# Patient Record
Sex: Male | Born: 1965 | Race: Black or African American | Hispanic: No | Marital: Single | Smoking: Never smoker
Health system: Southern US, Community
[De-identification: ages and names within clinical notes are randomized; demographics above are authoritative.]

---

## 2017-04-25 ENCOUNTER — Emergency Department (HOSPITAL_COMMUNITY)
Admission: EM | Admit: 2017-04-25 | Discharge: 2017-04-26 | Disposition: A | Discharge status: Home / Self Care | Payer: Self-pay | Attending: Emergency Medicine | Admitting: Emergency Medicine

## 2017-04-25 ENCOUNTER — Other Ambulatory Visit: Payer: Self-pay

## 2017-04-25 ENCOUNTER — Emergency Department (HOSPITAL_COMMUNITY): Payer: Self-pay

## 2017-04-25 ENCOUNTER — Encounter (HOSPITAL_COMMUNITY): Payer: Self-pay | Admitting: Emergency Medicine

## 2017-04-25 DIAGNOSIS — M25561 Pain in right knee: Secondary | ICD-10-CM | POA: Insufficient documentation

## 2017-04-25 LAB — CBC WITH DIFFERENTIAL/PLATELET
BASOS ABS: 0 10*3/uL (ref 0.0–0.1)
Basophils Relative: 0 %
Eosinophils Absolute: 0.1 10*3/uL (ref 0.0–0.7)
Eosinophils Relative: 1 %
HEMATOCRIT: 48.9 % (ref 39.0–52.0)
HEMOGLOBIN: 16.8 g/dL (ref 13.0–17.0)
LYMPHS ABS: 1.9 10*3/uL (ref 0.7–4.0)
LYMPHS PCT: 24 %
MCH: 32.7 pg (ref 26.0–34.0)
MCHC: 34.4 g/dL (ref 30.0–36.0)
MCV: 95.3 fL (ref 78.0–100.0)
Monocytes Absolute: 0.9 10*3/uL (ref 0.1–1.0)
Monocytes Relative: 11 %
NEUTROS ABS: 5.3 10*3/uL (ref 1.7–7.7)
Neutrophils Relative %: 64 %
Platelets: 198 10*3/uL (ref 150–400)
RBC: 5.13 MIL/uL (ref 4.22–5.81)
RDW: 12.1 % (ref 11.5–15.5)
WBC: 8.2 10*3/uL (ref 4.0–10.5)

## 2017-04-25 LAB — BASIC METABOLIC PANEL
ANION GAP: 8 (ref 5–15)
BUN: 12 mg/dL (ref 6–20)
CHLORIDE: 103 mmol/L (ref 101–111)
CO2: 26 mmol/L (ref 22–32)
Calcium: 9.3 mg/dL (ref 8.9–10.3)
Creatinine, Ser: 1.13 mg/dL (ref 0.61–1.24)
GFR calc Af Amer: >60 mL/min (ref >60)
GLUCOSE: 101 mg/dL — AB (ref 65–99)
POTASSIUM: 3.8 mmol/L (ref 3.5–5.1)
Sodium: 137 mmol/L (ref 135–145)

## 2017-04-25 LAB — CK: Total CK: 221 U/L (ref 49–397)

## 2017-04-25 MED ORDER — HYDROCODONE-ACETAMINOPHEN 5-325 MG PO TABS
2.0000 | ORAL_TABLET | Freq: Once | ORAL | Status: AC
  Administered 2017-04-25: 2 via ORAL
  Filled 2017-04-25: qty 2

## 2017-04-25 MED ORDER — HYDROCODONE-ACETAMINOPHEN 5-325 MG PO TABS: 1.0000 | ORAL_TABLET | Freq: Four times a day (QID) | ORAL | 0 refills | Status: DC | PRN

## 2017-04-25 NOTE — ED Triage Notes (Signed)
Pt states he took a nap this afternoon, when he woke up his right knee was swollen and he was unable to apply a lot of pressure. Pt denies injury/trauma, no long distance trips.

## 2017-04-25 NOTE — ED Provider Notes (Signed)
[facility] EMERGENCY DEPARTMENT Provider Note   CSN: [id number] Arrival date & time: 04/25/17  1623     History   Chief Complaint Chief Complaint  Patient presents with  . Leg Pain    HPI Celene SquibbSean Shutters is a 51 y.o. male.  Patient presents to the ED with a chief complaint of right knee pain and swelling.  He states that he slept on the couch, and may have twisted it in his sleep.  He states that sometimes his leg gets stuck in the couch, and he believes this may have happened and then he rolled over.  He is not 100% certain though.  He denies any other traumatic injuries.  He reports increased pain with ambulation and knee flexion.  He states that he feels a pressure in the upper part of the knee.  He denies any calf pain or ankle swelling.  Denies any fevers, chills, IV drug use, penile discharge.  He rates the pain as moderate.  He denies any other associated symptoms.   The history is provided by the patient. No language interpreter was used.    History reviewed. No pertinent past medical history.  There are no active problems to display for this patient.   History reviewed. No pertinent surgical history.     Home Medications    Prior to Admission medications   Not on File    Family History No family history on file.  Social History Social History   Tobacco Use  . Smoking status: Never Smoker  . Smokeless tobacco: Never Used  Substance Use Topics  . Alcohol use: Yes  . Drug use: No     Allergies   Patient has no known allergies.   Review of Systems Review of Systems  All other systems reviewed and are negative.    Physical Exam Updated Vital Signs BP (!) 170/93 (BP Location: Left Arm)   Pulse 87   Temp 98.7 F (37.1 C) (Oral)   Resp 18   Ht 5\' 8"  (1.727 m)   Wt 92.5 kg (204 lb)   SpO2 96%   BMI 31.02 kg/m   Physical Exam Nursing note and vitals reviewed.  Constitutional: Pt appears well-developed and well-nourished.  No distress.  HENT:  Head: Normocephalic and atraumatic.  Eyes: Conjunctivae are normal.  Neck: Normal range of motion.  Cardiovascular: Normal rate, regular rhythm. Intact distal pulses.   Capillary refill < 3 sec.  Pulmonary/Chest: Effort normal and breath sounds normal.  Musculoskeletal:  Right knee Pt exhibits moderate swelling and TTP anteriorly and superiorly.   ROM: 3/5 limited by pain  Strength: 3/5 limited by pain  Neurological: Pt  is alert. Coordination normal.  Sensation: 5/5 Skin: Skin is warm and dry. Pt is not diaphoretic.  No evidence of open wound or skin tenting Psychiatric: Pt has a normal mood and affect.     ED Treatments / Results  Labs (all labs ordered are listed, but only abnormal results are displayed) Labs Reviewed  BASIC METABOLIC PANEL - Abnormal; Notable for the following components:      Result Value   Glucose, Bld 101 (*)    All other components within normal limits  CBC WITH DIFFERENTIAL/PLATELET  CK    EKG  EKG Interpretation None       Radiology No results found.  Procedures Procedures (including critical care time)  Medications Ordered in ED Medications  HYDROcodone-acetaminophen (NORCO/VICODIN) 5-325 MG per tablet 2 tablet (not administered)  Initial Impression / Assessment and Plan / ED Course  I have reviewed the triage vital signs and the nursing notes.  Pertinent labs & imaging results that were available during my care of the patient were reviewed by me and considered in my medical decision making (see chart for details).     Patient presents with pain in right knee.  DDx includes, fracture, strain, or sprain.  Consultants: none  Plain films reveal soft tissue edema, no fracture, dislocation, or effusion.  No sign of septic joint, DVT, or abscess.  Labs ordered in triage are reassuring. Pt advised to follow up with PCP and/or orthopedics. Patient given brace and crutches while in ED, conservative therapy  such as RICE recommended and discussed.   Patient will be discharged home & is agreeable with above plan. Returns precautions discussed. Pt appears safe for discharge.   Final Clinical Impressions(s) / ED Diagnoses   Final diagnoses:  Acute pain of right knee    ED Discharge Orders        Ordered    HYDROcodone-acetaminophen (NORCO/VICODIN) 5-325 MG tablet  Every 6 hours PRN     04/25/17 2341       Roxy HorsemanBrowning, Manan Olmo, PA-C 04/25/17 2343    Little, Ambrose Finlandachel Morgan, MD 04/27/17 1009

## 2017-04-25 NOTE — ED Notes (Addendum)
Pt also reports generalized muscle soreness and cramping.

## 2017-04-25 NOTE — ED Notes (Signed)
Patient transported to x-ray. ?

## 2017-04-26 NOTE — Progress Notes (Signed)
Orthopedic Tech Progress Note Patient Details:  Kenneth SquibbSean Harris 08/30/1965 [id number]  Ortho Devices Type of Ortho Device: Knee Immobilizer, Crutches Ortho Device/Splint Location: rle Ortho Device/Splint Interventions: Ordered, Application, Adjustment   Trinna PostMartinez, Eoin Willden J 04/26/2017, 12:26 AM

## 2017-08-30 ENCOUNTER — Ambulatory Visit (INDEPENDENT_AMBULATORY_CARE_PROVIDER_SITE_OTHER): Payer: PRIVATE HEALTH INSURANCE | Admitting: Family Medicine

## 2017-08-30 ENCOUNTER — Encounter: Payer: Self-pay | Admitting: Family Medicine

## 2017-08-30 VITALS — BP 130/90 | HR 77 | Ht 68.5 in | Wt 211.2 lb

## 2017-08-30 DIAGNOSIS — Z0001 Encounter for general adult medical examination with abnormal findings: Secondary | ICD-10-CM | POA: Diagnosis not present

## 2017-08-30 DIAGNOSIS — B002 Herpesviral gingivostomatitis and pharyngotonsillitis: Secondary | ICD-10-CM | POA: Insufficient documentation

## 2017-08-30 DIAGNOSIS — A6001 Herpesviral infection of penis: Secondary | ICD-10-CM

## 2017-08-30 DIAGNOSIS — S81812A Laceration without foreign body, left lower leg, initial encounter: Secondary | ICD-10-CM | POA: Insufficient documentation

## 2017-08-30 DIAGNOSIS — Z23 Encounter for immunization: Secondary | ICD-10-CM | POA: Diagnosis not present

## 2017-08-30 LAB — COMPREHENSIVE METABOLIC PANEL
ALT: 19 U/L (ref 0–53)
AST: 23 U/L (ref 0–37)
Albumin: 4 g/dL (ref 3.5–5.2)
Alkaline Phosphatase: 48 U/L (ref 39–117)
BUN: 15 mg/dL (ref 6–23)
CO2: 29 mEq/L (ref 19–32)
Calcium: 9 mg/dL (ref 8.4–10.5)
Chloride: 102 mEq/L (ref 96–112)
Creatinine, Ser: 1.1 mg/dL (ref 0.40–1.50)
GFR: 91.29 mL/min (ref >60.00)
Glucose, Bld: 96 mg/dL (ref 70–99)
Potassium: 3.9 mEq/L (ref 3.5–5.1)
Sodium: 140 mEq/L (ref 135–145)
Total Bilirubin: 0.5 mg/dL (ref 0.2–1.2)
Total Protein: 6.7 g/dL (ref 6.0–8.3)

## 2017-08-30 LAB — URINALYSIS, ROUTINE W REFLEX MICROSCOPIC
Bilirubin Urine: NEGATIVE
Ketones, ur: NEGATIVE
Nitrite: NEGATIVE
Specific Gravity, Urine: 1.025 (ref 1.000–1.030)
Total Protein, Urine: NEGATIVE
Urine Glucose: NEGATIVE
Urobilinogen, UA: 0.2 (ref 0.0–1.0)
pH: 6 (ref 5.0–8.0)

## 2017-08-30 LAB — LIPID PANEL
Cholesterol: 188 mg/dL (ref 0–200)
HDL: 25.6 mg/dL — ABNORMAL LOW (ref >39.00)
LDL Cholesterol: 128 mg/dL — ABNORMAL HIGH (ref 0–99)
NonHDL: 162.58
Total CHOL/HDL Ratio: 7
Triglycerides: 171 mg/dL — ABNORMAL HIGH (ref 0.0–149.0)
VLDL: 34.2 mg/dL (ref 0.0–40.0)

## 2017-08-30 LAB — CBC
HCT: 46.4 % (ref 39.0–52.0)
Hemoglobin: 15.9 g/dL (ref 13.0–17.0)
MCHC: 34.3 g/dL (ref 30.0–36.0)
MCV: 94.6 fl (ref 78.0–100.0)
Platelets: 207 10*3/uL (ref 150.0–400.0)
RBC: 4.91 Mil/uL (ref 4.22–5.81)
RDW: 12.6 % (ref 11.5–15.5)
WBC: 3.7 10*3/uL — ABNORMAL LOW (ref 4.0–10.5)

## 2017-08-30 LAB — PSA: PSA: 6.61 ng/mL — ABNORMAL HIGH (ref 0.10–4.00)

## 2017-08-30 MED ORDER — VALACYCLOVIR HCL 1 G PO TABS
500.0000 mg | ORAL_TABLET | Freq: Two times a day (BID) | ORAL | 0 refills | Status: AC
Start: 2017-08-30 — End: 2017-09-02

## 2017-08-30 NOTE — Patient Instructions (Signed)
Health Maintenance, Male A healthy lifestyle and preventive care is important for your health and wellness. Ask your health care provider about what schedule of regular examinations is right for you. What should I know about weight and diet? Eat a Healthy Diet  Eat plenty of vegetables, fruits, whole grains, low-fat dairy products, and lean protein.  Do not eat a lot of foods high in solid fats, added sugars, or salt.  Maintain a Healthy Weight Regular exercise can help you achieve or maintain a healthy weight. You should:  Do at least 150 minutes of exercise each week. The exercise should increase your heart rate and make you sweat (moderate-intensity exercise).  Do strength-training exercises at least twice a week.  Watch Your Levels of Cholesterol and Blood Lipids  Have your blood tested for lipids and cholesterol every 5 years starting at 50 years of age. If you are at high risk for heart disease, you should start having your blood tested when you are 50 years old. You may need to have your cholesterol levels checked more often if: ? Your lipid or cholesterol levels are high. ? You are older than 50 years of age. ? You are at high risk for heart disease.  What should I know about cancer screening? Many types of cancers can be detected early and may often be prevented. Lung Cancer  You should be screened every year for lung cancer if: ? You are a current smoker who has smoked for at least 30 years. ? You are a former smoker who has quit within the past 15 years.  Talk to your health care provider about your screening options, when you should start screening, and how often you should be screened.  Colorectal Cancer  Routine colorectal cancer screening usually begins at 50 years of age and should be repeated every 5-10 years until you are 50 years old. You may need to be screened more often if early forms of precancerous polyps or small growths are found. Your health care provider  may recommend screening at an earlier age if you have risk factors for colon cancer.  Your health care provider may recommend using home test kits to check for hidden blood in the stool.  A small camera at the end of a tube can be used to examine your colon (sigmoidoscopy or colonoscopy). This checks for the earliest forms of colorectal cancer.  Prostate and Testicular Cancer  Depending on your age and overall health, your health care provider may do certain tests to screen for prostate and testicular cancer.  Talk to your health care provider about any symptoms or concerns you have about testicular or prostate cancer.  Skin Cancer  Check your skin from head to toe regularly.  Tell your health care provider about any new moles or changes in moles, especially if: ? There is a change in a mole's size, shape, or color. ? You have a mole that is larger than a pencil eraser.  Always use sunscreen. Apply sunscreen liberally and repeat throughout the day.  Protect yourself by wearing long sleeves, pants, a wide-brimmed hat, and sunglasses when outside.  What should I know about heart disease, diabetes, and high blood pressure?  If you are 89-13 years of age, have your blood pressure checked every 3-5 years. If you are 70 years of age or older, have your blood pressure checked every year. You should have your blood pressure measured twice-once when you are at a hospital or clinic, and once when  you are not at a hospital or clinic. Record the average of the two measurements. To check your blood pressure when you are not at a hospital or clinic, you can use: ? An automated blood pressure machine at a pharmacy. ? A home blood pressure monitor.  Talk to your health care provider about your target blood pressure.  If you are between 59-6 years old, ask your health care provider if you should take aspirin to prevent heart disease.  Have regular diabetes screenings by checking your fasting blood  sugar level. ? If you are at a normal weight and have a low risk for diabetes, have this test once every three years after the age of 32. ? If you are overweight and have a high risk for diabetes, consider being tested at a younger age or more often.  A one-time screening for abdominal aortic aneurysm (AAA) by ultrasound is recommended for men aged 27-75 years who are current or former smokers. What should I know about preventing infection? Hepatitis B If you have a higher risk for hepatitis B, you should be screened for this virus. Talk with your health care provider to find out if you are at risk for hepatitis B infection. Hepatitis C Blood testing is recommended for:  Everyone born from [birth year] through 1963.  Anyone with known risk factors for hepatitis C.  Sexually Transmitted Diseases (STDs)  You should be screened each year for STDs including gonorrhea and chlamydia if: ? You are sexually active and are younger than 50 years of age. ? You are older than 50 years of age and your health care provider tells you that you are at risk for this type of infection. ? Your sexual activity has changed since you were last screened and you are at an increased risk for chlamydia or gonorrhea. Ask your health care provider if you are at risk.  Talk with your health care provider about whether you are at high risk of being infected with HIV. Your health care provider may recommend a prescription medicine to help prevent HIV infection.  What else can I do?  Schedule regular health, dental, and eye exams.  Stay current with your vaccines (immunizations).  Do not use any tobacco products, such as cigarettes, chewing tobacco, and e-cigarettes. If you need help quitting, ask your health care provider.  Limit alcohol intake to no more than 2 drinks per day. One drink equals 12 ounces of beer, 5 ounces of wine, or 1 ounces of hard liquor.  Do not use street drugs.  Do not share needles.  Ask your  health care provider for help if you need support or information about quitting drugs.  Tell your health care provider if you often feel depressed.  Tell your health care provider if you have ever been abused or do not feel safe at home. This information is not intended to replace advice given to you by your health care provider. Make sure you discuss any questions you have with your health care provider. Document Released: 11/12/2007 Document Revised: 01/13/2016 Document Reviewed: 02/17/2015 Elsevier Interactive Patient Education  2018 [employer] Years, Male Preventive care refers to lifestyle choices and visits with your health care provider that can promote health and wellness. What does preventive care include?  A yearly physical exam. This is also called an annual well check.  Dental exams once or twice a year.  Routine eye exams. Ask your health care provider how often you should have  your eyes checked.  Personal lifestyle choices, including: ? Daily care of your teeth and gums. ? Regular physical activity. ? Eating a healthy diet. ? Avoiding tobacco and drug use. ? Limiting alcohol use. ? Practicing safe sex. ? Taking low-dose aspirin every day starting at age 50. What happens during an annual well check? The services and screenings done by your health care provider during your annual well check will depend on your age, overall health, lifestyle risk factors, and family history of disease. Counseling Your health care provider may ask you questions about your:  Alcohol use.  Tobacco use.  Drug use.  Emotional well-being.  Home and relationship well-being.  Sexual activity.  Eating habits.  Work and work environment.  Screening You may have the following tests or measurements:  Height, weight, and BMI.  Blood pressure.  Lipid and cholesterol levels. These may be checked every 5 years, or more frequently if you are over 50 years  old.  Skin check.  Lung cancer screening. You may have this screening every year starting at age 55 if you have a 30-pack-year history of smoking and currently smoke or have quit within the past 15 years.  Fecal occult blood test (FOBT) of the stool. You may have this test every year starting at age 50.  Flexible sigmoidoscopy or colonoscopy. You may have a sigmoidoscopy every 5 years or a colonoscopy every 10 years starting at age 50.  Prostate cancer screening. Recommendations will vary depending on your family history and other risks.  Hepatitis C blood test.  Hepatitis B blood test.  Sexually transmitted disease (STD) testing.  Diabetes screening. This is done by checking your blood sugar (glucose) after you have not eaten for a while (fasting). You may have this done every 1-3 years.  Discuss your test results, treatment options, and if necessary, the need for more tests with your health care provider. Vaccines Your health care provider may recommend certain vaccines, such as:  Influenza vaccine. This is recommended every year.  Tetanus, diphtheria, and acellular pertussis (Tdap, Td) vaccine. You may need a Td booster every 10 years.  Varicella vaccine. You may need this if you have not been vaccinated.  Zoster vaccine. You may need this after age 60.  Measles, mumps, and rubella (MMR) vaccine. You may need at least one dose of MMR if you were born in 1957 or later. You may also need a second dose.  Pneumococcal 13-valent conjugate (PCV13) vaccine. You may need this if you have certain conditions and have not been vaccinated.  Pneumococcal polysaccharide (PPSV23) vaccine. You may need one or two doses if you smoke cigarettes or if you have certain conditions.  Meningococcal vaccine. You may need this if you have certain conditions.  Hepatitis A vaccine. You may need this if you have certain conditions or if you travel or work in places where you may be exposed to  hepatitis A.  Hepatitis B vaccine. You may need this if you have certain conditions or if you travel or work in places where you may be exposed to hepatitis B.  Haemophilus influenzae type b (Hib) vaccine. You may need this if you have certain risk factors.  Talk to your health care provider about which screenings and vaccines you need and how often you need them. This information is not intended to replace advice given to you by your health care provider. Make sure you discuss any questions you have with your health care provider. Document Released: 06/12/2015 Document   Revised: 02/03/2016 Document Reviewed: 03/17/2015 Elsevier Interactive Patient Education  2018 [employer].  Genital Herpes Genital herpes is a common sexually transmitted infection (STI) that is caused by a virus. The virus spreads from person to person through sexual contact. Infection can cause itching, blisters, and sores around the genitals or rectum. Symptoms may last several days and then go away This is called an outbreak. However, the virus remains in your body, so you may have more outbreaks in the future. The time between outbreaks varies and can be months or years. Genital herpes affects men and women. It is particularly concerning for pregnant women because the virus can be passed to the baby during delivery and can cause serious problems. Genital herpes is also a concern for people who have a weak disease-fighting (immune) system. What are the causes? This condition is caused by the herpes simplex virus (HSV) type 1 or type 2. The virus may spread through:  Sexual contact with an infected person, including vaginal, anal, and oral sex.  Contact with fluid from a herpes sore.  The skin. This means that you can get herpes from an infected partner even if he or she does not have a visible sore or does not know that he or she is infected.  What increases the risk? You are more likely to develop this condition  if:  You have sex with many partners.  You do not use latex condoms during sex.  What are the signs or symptoms? Most people do not have symptoms (asymptomatic) or have mild symptoms that may be mistaken for other skin problems. Symptoms may include:  Small red bumps near the genitals, rectum, or mouth. These bumps turn into blisters and then turn into sores.  Flu-like symptoms, including: ? Fever. ? Body aches. ? Swollen lymph nodes. ? Headache.  Painful urination.  Pain and itching in the genital area or rectal area.  Vaginal discharge.  Tingling or shooting pain in the legs and buttocks.  Generally, symptoms are more severe and last longer during the first (primary) outbreak. Flu-like symptoms are also more common during the primary outbreak. How is this diagnosed? Genital herpes may be diagnosed based on:  A physical exam.  Your medical history.  Blood tests.  A test of a fluid sample (culture) from an open sore.  How is this treated? There is no cure for this condition, but treatment with antiviral medicines that are taken by mouth (orally) can do the following:  Speed up healing and relieve symptoms.  Help to reduce the spread of the virus to sexual partners.  Limit the chance of future outbreaks, or make future outbreaks shorter.  Lessen symptoms of future outbreaks.  Your health care provider may also recommend pain relief medicines, such as aspirin or ibuprofen. Follow these instructions at home: Sexual activity  Do not have sexual contact during active outbreaks.  Practice safe sex. Latex condoms and male condoms may help prevent the spread of the herpes virus. General instructions  Keep the affected areas dry and clean.  Take over-the-counter and prescription medicines only as told by your health care provider.  Avoid rubbing or touching blisters and sores. If you do touch blisters or sores: ? Wash your hands thoroughly with soap and  water. ? Do not touch your eyes afterward.  To help relieve pain or itching, you may take the following actions as directed by your health care provider: ? Apply a cold, wet cloth (cold compress) to affected areas 4-6  times a day. ? Apply a substance that protects your skin and reduces bleeding (astringent). ? Apply a gel that helps relieve pain around sores (lidocaine gel). ? Take a warm, shallow bath that cleans the genital area (sitz bath).  Keep all follow-up visits as told by your health care provider. This is important. How is this prevented?  Use condoms. Although anyone can get genital herpes during sexual contact, even with the use of a condom, a condom can provide some protection.  Avoid having multiple sexual partners.  Talk with your sexual partner about any symptoms either of you may have. Also, talk with your partner about any history of STIs.  Get tested for STIs before you have sex. Ask your partner to do the same.  Do not have sexual contact if you have symptoms of genital herpes. Contact a health care provider if:  Your symptoms are not improving with medicine.  Your symptoms return.  You have new symptoms.  You have a fever.  You have abdominal pain.  You have redness, swelling, or pain in your eye.  You notice new sores on other parts of your body.  You are a woman and experience bleeding between menstrual periods.  You have had herpes and you become pregnant or plan to become pregnant. Summary  Genital herpes is a common sexually transmitted infection (STI) that is caused by the herpes simplex virus (HSV) type 1 or type 2.  These viruses are most often spread through sexual contact with an infected person.  You are more likely to develop this condition if you have sex with many partners or you have unprotected sex.  Most people do not have symptoms (asymptomatic) or have mild symptoms that may be mistaken for other skin problems. Symptoms occur as  outbreaks that may happen months or years apart.  There is no cure for this condition, but treatment with oral antiviral medicines can reduce symptoms, reduce the chance of spreading the virus to a partner, prevent future outbreaks, or shorten future outbreaks. This information is not intended to replace advice given to you by your health care provider. Make sure you discuss any questions you have with your health care provider. Document Released: 05/13/2000 Document Revised: 04/15/2016 Document Reviewed: 04/15/2016 Elsevier Interactive Patient Education  [employer].

## 2017-08-30 NOTE — Progress Notes (Signed)
Subjective:  Patient ID: Kenneth Harris, male    DOB: 05/04/1966  Age: 52 y.o. MRN: [id number]  CC: Establish Care   HPI Kenneth Harris presents for establishment of care and for a physical exam.  He is fasting this morning.  He enjoys good health.  He goes to the gym 4-5 times a week.  He does not smoke or use illicit drugs.  He drinks alcohol occasionally.  He is a DOT driver with an active CDL.  License is due in September.  Past medical history of elevated blood pressure that was more apparent when he lived in [place] a [place] daily route for 18 years.  His blood pressure is dropped since he moved down into this area.  His significant other is expecting a baby soon.  He has a laceration that is healing on his left shin.  He has a 50 year old son who has asthma.  Father's health history is unknown.  His mother has arthritis and she is in her 6080s.  He has a past medical history of genital herpes but has not had an outbreak in over a year and a half.  He has noticed some myopia.  He has noticed some presbycusis.  He has noticed occasional prevoid dribbling mild decrease in the force of his urine stream.  He is not interested in having these treated at this time.  History Kenneth Harris has no past medical history on file.   He has no past surgical history on file.   His family history is not on file.He reports that he has never smoked. He has never used smokeless tobacco. He reports that he drinks alcohol. He reports that he does not use drugs.  Outpatient Medications Prior to Visit  Medication Sig Dispense Refill  . HYDROcodone-acetaminophen (NORCO/VICODIN) 5-325 MG tablet Take 1-2 tablets by mouth every 6 (six) hours as needed. 10 tablet 0   No facility-administered medications prior to visit.     ROS Review of Systems  Constitutional: Negative for chills, fatigue and fever.  HENT: Negative.   Eyes: Negative.   Respiratory: Negative.   Cardiovascular: Negative.   Gastrointestinal:  Negative.   Endocrine: Negative for polyphagia and polyuria.  Genitourinary: Negative for difficulty urinating, frequency and urgency.  Musculoskeletal: Negative.   Skin: Negative for color change and rash.  Allergic/Immunologic: Negative for immunocompromised state.  Neurological: Negative for weakness, light-headedness and headaches.  Hematological: Does not bruise/bleed easily.  Psychiatric/Behavioral: Negative.     Objective:  BP 130/90 (BP Location: Left Arm, Patient Position: Sitting, Cuff Size: Normal)   Pulse 77   Ht 5' 8.5" (1.74 m)   Wt 211 lb 4 oz (95.8 kg)   SpO2 99%   BMI 31.65 kg/m   Physical Exam  Constitutional: He is oriented to person, place, and time. He appears well-developed and well-nourished. No distress.  HENT:  Head: Normocephalic and atraumatic.  Right Ear: External ear normal.  Left Ear: External ear normal.  Mouth/Throat: Oropharynx is clear and moist.  Eyes: Pupils are equal, round, and reactive to light. Conjunctivae and EOM are normal. Right eye exhibits no discharge. Left eye exhibits no discharge. No scleral icterus.  Neck: Normal range of motion. Neck supple. No JVD present. No tracheal deviation present. No thyromegaly present.  Cardiovascular: Normal rate, regular rhythm and normal heart sounds.  Pulmonary/Chest: Effort normal and breath sounds normal. No stridor.  Abdominal: Soft. Bowel sounds are normal. He exhibits no distension. There is no tenderness. There is no  rebound.  Genitourinary: Rectal exam shows no external hemorrhoid, no internal hemorrhoid, no fissure, no mass, no tenderness, anal tone normal and guaiac negative stool. Prostate is enlarged. Prostate is not tender.  Lymphadenopathy:    He has no cervical adenopathy.  Neurological: He is alert and oriented to person, place, and time.  Skin: Skin is warm and dry. He is not diaphoretic.  Psychiatric: He has a normal mood and affect. His behavior is normal.      Assessment &  Plan:   Kipton was seen today for establish care.  Diagnoses and all orders for this visit:  Encounter for health maintenance examination with abnormal findings -     CBC -     Comprehensive metabolic panel -     HIV antibody -     Lipid panel -     Urinalysis, Routine w reflex microscopic -     PSA  Laceration of left lower extremity, initial encounter -     Tdap vaccine greater than or equal to 7yo IM  Herpes simplex infection of penis -     valACYclovir (VALTREX) 1000 MG tablet; Take 0.5 tablets (500 mg total) by mouth 2 (two) times daily for 3 days. As needed for outbreaks.   I have discontinued Kenneth Harris's HYDROcodone-acetaminophen. I am also having him start on valACYclovir.  Meds ordered this encounter  Medications  . valACYclovir (VALTREX) 1000 MG tablet    Sig: Take 0.5 tablets (500 mg total) by mouth 2 (two) times daily for 3 days. As needed for outbreaks.    Dispense:  6 tablet    Refill:  0     Follow-up: Return in about 3 months (around 11/29/2017).  Mliss Sax, MD

## 2017-08-31 ENCOUNTER — Encounter: Payer: Self-pay | Admitting: Family Medicine

## 2017-08-31 LAB — HIV ANTIBODY (ROUTINE TESTING W REFLEX): HIV: NONREACTIVE

## 2017-09-04 ENCOUNTER — Other Ambulatory Visit (HOSPITAL_COMMUNITY)
Admission: RE | Admit: 2017-09-04 | Discharge: 2017-09-04 | Disposition: A | Discharge status: Home / Self Care | Payer: PRIVATE HEALTH INSURANCE | Source: Ambulatory Visit | Attending: Family Medicine | Admitting: Family Medicine

## 2017-09-04 ENCOUNTER — Encounter: Payer: Self-pay | Admitting: Family Medicine

## 2017-09-04 ENCOUNTER — Ambulatory Visit (INDEPENDENT_AMBULATORY_CARE_PROVIDER_SITE_OTHER): Payer: PRIVATE HEALTH INSURANCE | Admitting: Family Medicine

## 2017-09-04 VITALS — BP 142/80 | HR 96 | Ht 68.5 in | Wt 211.0 lb

## 2017-09-04 DIAGNOSIS — N410 Acute prostatitis: Secondary | ICD-10-CM | POA: Insufficient documentation

## 2017-09-04 DIAGNOSIS — E7800 Pure hypercholesterolemia, unspecified: Secondary | ICD-10-CM

## 2017-09-04 DIAGNOSIS — R972 Elevated prostate specific antigen [PSA]: Secondary | ICD-10-CM | POA: Diagnosis not present

## 2017-09-04 DIAGNOSIS — A749 Chlamydial infection, unspecified: Secondary | ICD-10-CM | POA: Diagnosis not present

## 2017-09-04 LAB — URINALYSIS, ROUTINE W REFLEX MICROSCOPIC
BILIRUBIN URINE: NEGATIVE
HGB URINE DIPSTICK: NEGATIVE
KETONES UR: NEGATIVE
NITRITE: NEGATIVE
Specific Gravity, Urine: 1.02 (ref 1.000–1.030)
TOTAL PROTEIN, URINE-UPE24: NEGATIVE
URINE GLUCOSE: NEGATIVE
UROBILINOGEN UA: 0.2 (ref 0.0–1.0)
pH: 6 (ref 5.0–8.0)

## 2017-09-04 MED ORDER — SULFAMETHOXAZOLE-TRIMETHOPRIM 800-160 MG PO TABS: 1.0000 | ORAL_TABLET | Freq: Two times a day (BID) | ORAL | 0 refills | Status: DC

## 2017-09-04 NOTE — Patient Instructions (Signed)
Fat and Cholesterol Restricted Diet Getting too much fat and cholesterol in your diet may cause health problems. Following this diet helps keep your fat and cholesterol at normal levels. This can keep you from getting sick. What types of fat should I choose?  Choose monosaturated and polyunsaturated fats. These are found in foods such as olive oil, canola oil, flaxseeds, walnuts, almonds, and seeds.  Eat more omega-3 fats. Good choices include salmon, mackerel, sardines, tuna, flaxseed oil, and ground flaxseeds.  Limit saturated fats. These are in animal products such as meats, butter, and cream. They can also be in plant products such as palm oil, palm kernel oil, and coconut oil.  Avoid foods with partially hydrogenated oils in them. These contain trans fats. Examples of foods that have trans fats are stick margarine, some tub margarines, cookies, crackers, and other baked goods. What general guidelines do I need to follow?  Check food labels. Look for the words "trans fat" and "saturated fat."  When preparing a meal: ? Fill half of your plate with vegetables and green salads. ? Fill one fourth of your plate with whole grains. Look for the word "whole" as the first word in the ingredient list. ? Fill one fourth of your plate with lean protein foods.  Eat more foods that have fiber, like apples, carrots, beans, peas, and barley.  Eat more home-cooked foods. Eat less at restaurants and buffets.  Limit or avoid alcohol.  Limit foods high in starch and sugar.  Limit fried foods.  Cook foods without frying them. Baking, boiling, grilling, and broiling are all great options.  Lose weight if you are overweight. Losing even a small amount of weight can help your overall health. It can also help prevent diseases such as diabetes and heart disease. What foods can I eat? Grains Whole grains, such as whole wheat or whole grain breads, crackers, cereals, and pasta. Unsweetened oatmeal,  bulgur, barley, quinoa, or [place] rice. Corn or whole wheat flour tortillas. Vegetables Fresh or frozen vegetables (raw, steamed, roasted, or grilled). Green salads. Fruits All fresh, canned (in natural juice), or frozen fruits. Meat and Other Protein Products Ground beef (85% or leaner), grass-fed beef, or beef trimmed of fat. Skinless chicken or turkey. Ground chicken or turkey. Pork trimmed of fat. All fish and seafood. Eggs. Dried beans, peas, or lentils. Unsalted nuts or seeds. Unsalted canned or dry beans. Dairy Low-fat dairy products, such as skim or 1% milk, 2% or reduced-fat cheeses, low-fat ricotta or cottage cheese, or plain low-fat yogurt. Fats and Oils Tub margarines without trans fats. Light or reduced-fat mayonnaise and salad dressings. Avocado. Olive, canola, sesame, or safflower oils. Natural peanut or almond butter (choose ones without added sugar and oil). The items listed above may not be a complete list of recommended foods or beverages. Contact your dietitian for more options. What foods are not recommended? Grains White bread. White pasta. White rice. Cornbread. Bagels, pastries, and croissants. Crackers that contain trans fat. Vegetables White potatoes. Corn. Creamed or fried vegetables. Vegetables in a cheese sauce. Fruits Dried fruits. Canned fruit in light or heavy syrup. Fruit juice. Meat and Other Protein Products Fatty cuts of meat. Ribs, chicken wings, bacon, sausage, bologna, salami, chitterlings, fatback, hot dogs, bratwurst, and packaged luncheon meats. Liver and organ meats. Dairy Whole or 2% milk, cream, half-and-half, and cream cheese. Whole milk cheeses. Whole-fat or sweetened yogurt. Full-fat cheeses. Nondairy creamers and whipped toppings. Processed cheese, cheese spreads, or cheese curds. Sweets and Desserts Corn   syrup, sugars, honey, and molasses. Candy. Jam and jelly. Syrup. Sweetened cereals. Cookies, pies, cakes, donuts, muffins, and ice  cream. Fats and Oils Butter, stick margarine, lard, shortening, ghee, or bacon fat. Coconut, palm kernel, or palm oils. Beverages Alcohol. Sweetened drinks (such as sodas, lemonade, and fruit drinks or punches). The items listed above may not be a complete list of foods and beverages to avoid. Contact your dietitian for more information. This information is not intended to replace advice given to you by your health care provider. Make sure you discuss any questions you have with your health care provider. Document Released: 11/15/2011 Document Revised: 01/21/2016 Document Reviewed: 08/15/2013 Elsevier Interactive Patient Education  2018 [employer].  Prostatitis Prostatitis is swelling or inflammation of the prostate gland. The prostate is a walnut-sized gland that is involved in the production of semen. It is located below a man's bladder, in front of the rectum. There are four types of prostatitis:  Chronic nonbacterial prostatitis. This is the most common type of prostatitis. It may be associated with a viral infection or autoimmune disorder.  Acute bacterial prostatitis. This is the least common type of prostatitis. It starts quickly and is usually associated with a bladder infection, high fever, and shaking chills. It can occur at any age.  Chronic bacterial prostatitis. This type usually results from acute bacterial prostatitis that happens repeatedly (is recurrent) or has not been treated properly. It can occur in men of any age but is most common among middle-aged men whose prostate has begun to get larger. The symptoms are not as severe as symptoms caused by acute bacterial prostatitis.  Prostatodynia or chronic pelvic pain syndrome (CPPS). This type is also called pelvic floor disorder. It is associated with increased muscular tone in the pelvis surrounding the prostate.  What are the causes? Bacterial prostatitis is caused by infection from bacteria. Chronic nonbacterial  prostatitis may be caused by:  Urinary tract infections (UTIs).  Nerve damage.  A response by the body's disease-fighting system (autoimmune response).  Chemicals in the urine.  The causes of the other types of prostatitis are usually not known. What are the signs or symptoms? Symptoms of this condition vary depending upon the type of prostatitis. If you have acute bacterial prostatitis, you may experience:  Urinary symptoms, such as: ? Painful urination. ? Burning during urination. ? Frequent and sudden urges to urinate. ? Inability to start urinating. ? A weak or interrupted stream of urine.  Vomiting.  Nausea.  Fever.  Chills.  Inability to empty the bladder completely.  Pain in the: ? Muscles or joints. ? Lower back. ? Lower abdomen.  If you have any of the other types of prostatitis, you may experience:  Urinary symptoms, such as: ? Sudden urges to urinate. ? Frequent urination. ? Difficulty starting urination. ? Weak urine stream. ? Dribbling after urination.  Discharge from the urethra. The urethra is a tube that opens at the end of the penis.  Pain in the: ? Testicles. ? Penis or tip of the penis. ? Rectum. ? Area in front of the rectum and below the scrotum (perineum).  Problems with sexual function.  Painful ejaculation.  Bloody semen.  How is this diagnosed? This condition may be diagnosed based on:  A physical and medical exam.  Your symptoms.  A urine test to check for bacteria.  An exam in which a health care provider uses a finger to feel the prostate (digital rectal exam).  A test of a sample  of semen.  Blood tests.  Ultrasound.  Removal of prostate tissue to be examined under a microscope (biopsy).  Tests to check how your body handles urine (urodynamic tests).  A test to look inside your bladder or urethra (cystoscopy).  How is this treated? Treatment for this condition depends on the type of prostatitis. Treatment  may involve:  Medicines to relieve pain or inflammation.  Medicines to help relax your muscles.  Physical therapy.  Heat therapy.  Techniques to help you control certain body functions (biofeedback).  Relaxation exercises.  Antibiotic medicine, if your condition is caused by bacteria.  Warm water baths (sitz baths). Sitz baths help with relaxing your pelvic floor muscles, which helps to relieve pressure on the prostate.  Follow these instructions at home:  Take over-the-counter and prescription medicines only as told by your health care provider.  If you were prescribed an antibiotic, take it as told by your health care provider. Do not stop taking the antibiotic even if you start to feel better.  If physical therapy, biofeedback, or relaxation exercises were prescribed, do exercises as instructed.  Take sitz baths as directed by your health care provider. For a sitz bath, sit in warm water that is deep enough to cover your hips and buttocks.  Keep all follow-up visits as told by your health care provider. This is important. Contact a health care provider if:  Your symptoms get worse.  You have a fever. Get help right away if:  You have chills.  You feel nauseous.  You vomit.  You feel light-headed or feel like you are going to faint.  You are unable to urinate.  You have blood or blood clots in your urine. This information is not intended to replace advice given to you by your health care provider. Make sure you discuss any questions you have with your health care provider. Document Released: 05/13/2000 Document Revised: 02/04/2016 Document Reviewed: 02/04/2016 Elsevier Interactive Patient Education  2017 [employer]

## 2017-09-04 NOTE — Progress Notes (Addendum)
Subjective:  Patient ID: Kenneth SquibbSean Harris, male    DOB: 01/13/1966  Age: 52 y.o. MRN: [id number]  CC: Follow-up (elevated psa)   HPI Kenneth SquibbSean Coletti presents for follow-up of his lab work.  PSA was elevated.  His father's health history is unknown.  Patient has been experiencing urinary hesitancy with decreased flow of his urine is straining and to void dribbling.  Urine x1.  He has no dysuria.  No urgency.  I had originally diagnosed him with genital herpes but he tells me that he has never had a herpetic rash on his penis.  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  He is having no dysuria or discharge from his penis.  His significant other is pregnant.  She has had some trouble with cystitis.  No outpatient medications prior to visit.   No facility-administered medications prior to visit.     ROS Review of Systems  Respiratory: Negative.   Cardiovascular: Negative.   Gastrointestinal: Negative.   Genitourinary: Positive for difficulty urinating. Negative for decreased urine volume, discharge, dysuria, flank pain, frequency, genital sores, hematuria, penile pain and urgency.  Skin: Negative for pallor, rash and wound.  Hematological: Does not bruise/bleed easily.  Psychiatric/Behavioral: Negative.     Objective:  BP (!) 142/80 (BP Location: Left Arm, Patient Position: Sitting, Cuff Size: Normal)   Pulse 96   Ht 5' 8.5" (1.74 m)   Wt 211 lb (95.7 kg)   SpO2 96%   BMI 31.62 kg/m   BP Readings from Last 3 Encounters:  09/04/17 (!) 142/80  08/30/17 130/90  04/25/17 (!) 145/82    Wt Readings from Last 3 Encounters:  09/04/17 211 lb (95.7 kg)  08/30/17 211 lb 4 oz (95.8 kg)  04/25/17 204 lb (92.5 kg)    Physical Exam  Constitutional: He is oriented to person, place, and time. He appears well-developed and well-nourished.  HENT:  Head: Normocephalic and atraumatic.  Right Ear: External ear normal.  Left Ear: External ear normal.  Eyes: Right eye exhibits  no discharge. Left eye exhibits no discharge. No scleral icterus.  Pulmonary/Chest: Effort normal.  Neurological: He is alert and oriented to person, place, and time.  Psychiatric: He has a normal mood and affect. His behavior is normal.    Lab Results  Component Value Date   WBC 3.7 (L) 08/30/2017   HGB 15.9 08/30/2017   HCT 46.4 08/30/2017   PLT 207.0 08/30/2017   GLUCOSE 96 08/30/2017   CHOL 188 08/30/2017   TRIG 171.0 (H) 08/30/2017   HDL 25.60 (L) 08/30/2017   LDLCALC 128 (H) 08/30/2017   ALT 19 08/30/2017   AST 23 08/30/2017   NA 140 08/30/2017   K 3.9 08/30/2017   CL 102 08/30/2017   CREATININE 1.10 08/30/2017   BUN 15 08/30/2017   CO2 29 08/30/2017   PSA 6.61 (H) 08/30/2017    Dg Knee Complete 4 Views Right  Result Date: 04/25/2017 CLINICAL DATA:  Awoke today with diffuse right knee pain and swelling. No known injury. EXAM: RIGHT KNEE - COMPLETE 4+ VIEW COMPARISON:  None. FINDINGS: No evidence of fracture, dislocation, or joint effusion. No evidence of arthropathy or other focal bone abnormality. Subcutaneous soft tissue edema. IMPRESSION: Soft tissue edema.  No joint effusion or focal osseous abnormality. Electronically Signed   By: Rubye OaksMelanie  Ehinger M.D.   On: 04/25/2017 23:30    Assessment & Plan:   Gregary SignsSean was seen today for follow-up.  Diagnoses  and all orders for this visit:  Elevated PSA, less than 10 ng/ml  Acute prostatitis -     Urinalysis, Routine w reflex microscopic -     Urine Culture -     Urine cytology ancillary only -     Discontinue: sulfamethoxazole-trimethoprim (BACTRIM DS,SEPTRA DS) 800-160 MG tablet; Take 1 tablet by mouth 2 (two) times daily for 10 days. -     doxycycline (VIBRA-TABS) 100 MG tablet; Take 1 tablet (100 mg total) by mouth 2 (two) times daily.  Elevated LDL cholesterol level  Positive Chlamydia PCR -     doxycycline (VIBRA-TABS) 100 MG tablet; Take 1 tablet (100 mg total) by mouth 2 (two) times daily.   I have  discontinued Lanson Lembo's sulfamethoxazole-trimethoprim. I am also having him start on doxycycline.  Meds ordered this encounter  Medications  . DISCONTD: sulfamethoxazole-trimethoprim (BACTRIM DS,SEPTRA DS) 800-160 MG tablet    Sig: Take 1 tablet by mouth 2 (two) times daily for 10 days.    Dispense:  20 tablet    Refill:  0  . doxycycline (VIBRA-TABS) 100 MG tablet    Sig: Take 1 tablet (100 mg total) by mouth 2 (two) times daily.    Dispense:  20 tablet    Refill:  0   I am rechecking his urine and adding a culture and cytology today.  We will go ahead and treat presumptively for prostatitis.  Stressed the importance of follow-up in 2 months to have the PSA rechecked or sooner if his voiding symptoms do not improve with antibiotic treatment..  Patient is aware of the cancer is on the differential.  Follow-up: Return in about 2 months (around 11/04/2017), or if symptoms worsen or fail to improve.  Mliss Sax, MD

## 2017-09-05 LAB — URINE CULTURE
MICRO NUMBER:: 90429619
Result:: NO GROWTH
SPECIMEN QUALITY:: ADEQUATE

## 2017-09-05 LAB — URINE CYTOLOGY ANCILLARY ONLY
Chlamydia: POSITIVE — AB
NEISSERIA GONORRHEA: NEGATIVE

## 2017-09-06 MED ORDER — DOXYCYCLINE HYCLATE 100 MG PO TABS
100.0000 mg | ORAL_TABLET | Freq: Two times a day (BID) | ORAL | 0 refills | Status: DC
Start: 2017-09-06 — End: 2019-07-10

## 2017-09-06 NOTE — Addendum Note (Signed)
Addended by: Andrez GrimeKREMER, WILLIAM A on: 09/06/2017 08:02 AM   Modules accepted: Orders

## 2017-11-29 ENCOUNTER — Ambulatory Visit: Payer: PRIVATE HEALTH INSURANCE | Admitting: Family Medicine

## 2018-09-19 ENCOUNTER — Telehealth: Payer: Self-pay | Admitting: Family Medicine

## 2018-09-19 NOTE — Telephone Encounter (Signed)
I called patient to schedule a follow up with Dr. Doreene Burke. Patient stated that he is no longer seeing Dr. Doreene Burke as his PCP.

## 2019-01-28 ENCOUNTER — Encounter: Payer: PRIVATE HEALTH INSURANCE | Admitting: Gastroenterology

## 2019-06-26 ENCOUNTER — Ambulatory Visit: Payer: PRIVATE HEALTH INSURANCE | Attending: Internal Medicine

## 2019-06-26 DIAGNOSIS — Z20822 Contact with and (suspected) exposure to covid-19: Secondary | ICD-10-CM

## 2019-06-27 LAB — NOVEL CORONAVIRUS, NAA: SARS-CoV-2, NAA: NOT DETECTED

## 2019-07-01 ENCOUNTER — Ambulatory Visit: Payer: PRIVATE HEALTH INSURANCE | Attending: Internal Medicine

## 2019-07-01 DIAGNOSIS — Z20822 Contact with and (suspected) exposure to covid-19: Secondary | ICD-10-CM

## 2019-07-02 LAB — NOVEL CORONAVIRUS, NAA: SARS-CoV-2, NAA: NOT DETECTED

## 2019-07-03 ENCOUNTER — Telehealth: Payer: Self-pay | Admitting: *Deleted

## 2019-07-03 NOTE — Telephone Encounter (Signed)
Patient called given negative covid results . 

## 2019-07-09 ENCOUNTER — Other Ambulatory Visit: Payer: Self-pay

## 2019-07-10 ENCOUNTER — Inpatient Hospital Stay (HOSPITAL_COMMUNITY): Admit: 2019-07-10 | Payer: BLUE CROSS/BLUE SHIELD

## 2019-07-10 ENCOUNTER — Encounter: Payer: Self-pay | Admitting: Family Medicine

## 2019-07-10 ENCOUNTER — Ambulatory Visit (INDEPENDENT_AMBULATORY_CARE_PROVIDER_SITE_OTHER): Payer: BLUE CROSS/BLUE SHIELD

## 2019-07-10 ENCOUNTER — Ambulatory Visit (INDEPENDENT_AMBULATORY_CARE_PROVIDER_SITE_OTHER): Payer: BLUE CROSS/BLUE SHIELD | Admitting: Family Medicine

## 2019-07-10 VITALS — BP 156/100 | HR 92 | Temp 97.8°F | Ht 68.0 in | Wt 208.4 lb

## 2019-07-10 DIAGNOSIS — Z7721 Contact with and (suspected) exposure to potentially hazardous body fluids: Secondary | ICD-10-CM

## 2019-07-10 DIAGNOSIS — M2141 Flat foot [pes planus] (acquired), right foot: Secondary | ICD-10-CM

## 2019-07-10 DIAGNOSIS — I10 Essential (primary) hypertension: Secondary | ICD-10-CM | POA: Diagnosis not present

## 2019-07-10 DIAGNOSIS — M79672 Pain in left foot: Secondary | ICD-10-CM | POA: Diagnosis not present

## 2019-07-10 DIAGNOSIS — M2142 Flat foot [pes planus] (acquired), left foot: Secondary | ICD-10-CM | POA: Diagnosis not present

## 2019-07-10 DIAGNOSIS — R972 Elevated prostate specific antigen [PSA]: Secondary | ICD-10-CM

## 2019-07-10 DIAGNOSIS — M2140 Flat foot [pes planus] (acquired), unspecified foot: Secondary | ICD-10-CM | POA: Insufficient documentation

## 2019-07-10 LAB — URINALYSIS, ROUTINE W REFLEX MICROSCOPIC
Bilirubin Urine: NEGATIVE
Hgb urine dipstick: NEGATIVE
Ketones, ur: NEGATIVE
Leukocytes,Ua: NEGATIVE
Nitrite: NEGATIVE
RBC / HPF: NONE SEEN (ref 0–?)
Specific Gravity, Urine: 1.02 (ref 1.000–1.030)
Total Protein, Urine: NEGATIVE
Urine Glucose: NEGATIVE
Urobilinogen, UA: 0.2 (ref 0.0–1.0)
pH: 6.5 (ref 5.0–8.0)

## 2019-07-10 LAB — COMPREHENSIVE METABOLIC PANEL
ALT: 21 U/L (ref 0–53)
AST: 20 U/L (ref 0–37)
Albumin: 4.3 g/dL (ref 3.5–5.2)
Alkaline Phosphatase: 55 U/L (ref 39–117)
BUN: 12 mg/dL (ref 6–23)
CO2: 29 mEq/L (ref 19–32)
Calcium: 9.5 mg/dL (ref 8.4–10.5)
Chloride: 103 mEq/L (ref 96–112)
Creatinine, Ser: 1.1 mg/dL (ref 0.40–1.50)
GFR: 85.25 mL/min (ref >60.00)
Glucose, Bld: 96 mg/dL (ref 70–99)
Potassium: 3.6 mEq/L (ref 3.5–5.1)
Sodium: 138 mEq/L (ref 135–145)
Total Bilirubin: 0.4 mg/dL (ref 0.2–1.2)
Total Protein: 7.1 g/dL (ref 6.0–8.3)

## 2019-07-10 LAB — URIC ACID: Uric Acid, Serum: 7.5 mg/dL (ref 4.0–7.8)

## 2019-07-10 LAB — PSA: PSA: 1.28 ng/mL (ref 0.10–4.00)

## 2019-07-10 NOTE — Addendum Note (Signed)
Addended by: Varney Biles on: 07/10/2019 10:26 AM   Modules accepted: Orders

## 2019-07-10 NOTE — Progress Notes (Signed)
Established Patient Office Visit  Subjective:  Patient ID: Kenneth Harris, male    DOB: Mar 20, 1966  Age: 54 y.o. MRN: [id number]  CC:  Chief Complaint  Patient presents with  . Pain    left foot pain x 3 days becoming worse     HPI Kenneth Harris presents for evaluation and treatment of a 4-day history of left foot pain.  Pain is located in the mid area of his instep.  No specific injury but he does use a forklift at work.  No family history of gout.  Lost to follow-up for elevated blood pressure and PSA.  Tells me that he has blood pressure has been running in the 122 130/70 to 80s at home.  Would like to be checked for STDs.  He is having no symptoms.  History reviewed. No pertinent past medical history.  History reviewed. No pertinent surgical history.  History reviewed. No pertinent family history.  Social History   Socioeconomic History  . Marital status: Single    Spouse name: Not on file  . Number of children: Not on file  . Years of education: Not on file  . Highest education level: Not on file  Occupational History  . Not on file  Tobacco Use  . Smoking status: Never Smoker  . Smokeless tobacco: Never Used  Substance and Sexual Activity  . Alcohol use: Yes  . Drug use: No  . Sexual activity: Not on file  Other Topics Concern  . Not on file  Social History Narrative  . Not on file   Social Determinants of Health   Financial Resource Strain:   . Difficulty of Paying Living Expenses: Not on file  Food Insecurity:   . Worried About Charity fundraiser in the Last Year: Not on file  . Ran Out of Food in the Last Year: Not on file  Transportation Needs:   . Lack of Transportation (Medical): Not on file  . Lack of Transportation (Non-Medical): Not on file  Physical Activity:   . Days of Exercise per Week: Not on file  . Minutes of Exercise per Session: Not on file  Stress:   . Feeling of Stress : Not on file  Social Connections:   . Frequency of Communication  with Friends and Family: Not on file  . Frequency of Social Gatherings with Friends and Family: Not on file  . Attends Religious Services: Not on file  . Active Member of Clubs or Organizations: Not on file  . Attends Archivist Meetings: Not on file  . Marital Status: Not on file  Intimate Partner Violence:   . Fear of Current or Ex-Partner: Not on file  . Emotionally Abused: Not on file  . Physically Abused: Not on file  . Sexually Abused: Not on file    Outpatient Medications Prior to Visit  Medication Sig Dispense Refill  . doxycycline (VIBRA-TABS) 100 MG tablet Take 1 tablet (100 mg total) by mouth 2 (two) times daily. 20 tablet 0   No facility-administered medications prior to visit.    No Known Allergies  ROS Review of Systems  Constitutional: Negative for diaphoresis, fatigue, fever and unexpected weight change.  HENT: Negative.   Eyes: Negative for photophobia and visual disturbance.  Respiratory: Negative.   Cardiovascular: Negative.   Gastrointestinal: Negative.   Endocrine: Negative for polyphagia and polyuria.  Genitourinary: Negative.  Negative for difficulty urinating, discharge, dysuria, frequency, hematuria and urgency.  Musculoskeletal: Negative.   Allergic/Immunologic: Negative for  immunocompromised state.  Neurological: Negative for light-headedness and headaches.  Hematological: Does not bruise/bleed easily.      Objective:    Physical Exam  BP (!) 154/90   Pulse 92   Temp 97.8 F (36.6 C) (Tympanic)   Ht 5\' 8"  (1.727 m)   Wt 208 lb 6.4 oz (94.5 kg)   SpO2 96%   BMI 31.69 kg/m  Wt Readings from Last 3 Encounters:  07/10/19 208 lb 6.4 oz (94.5 kg)  09/04/17 211 lb (95.7 kg)  08/30/17 211 lb 4 oz (95.8 kg)     Health Maintenance Due  Topic Date Due  . COLONOSCOPY  02/04/2018  . INFLUENZA VACCINE  12/29/2018    There are no preventive care reminders to display for this patient.  No results found for: TSH Lab Results    Component Value Date   WBC 3.7 (L) 08/30/2017   HGB 15.9 08/30/2017   HCT 46.4 08/30/2017   MCV 94.6 08/30/2017   PLT 207.0 08/30/2017   Lab Results  Component Value Date   NA 140 08/30/2017   K 3.9 08/30/2017   CO2 29 08/30/2017   GLUCOSE 96 08/30/2017   BUN 15 08/30/2017   CREATININE 1.10 08/30/2017   BILITOT 0.5 08/30/2017   ALKPHOS 48 08/30/2017   AST 23 08/30/2017   ALT 19 08/30/2017   PROT 6.7 08/30/2017   ALBUMIN 4.0 08/30/2017   CALCIUM 9.0 08/30/2017   ANIONGAP 8 04/25/2017   GFR 91.29 08/30/2017   Lab Results  Component Value Date   CHOL 188 08/30/2017   Lab Results  Component Value Date   HDL 25.60 (L) 08/30/2017   Lab Results  Component Value Date   LDLCALC 128 (H) 08/30/2017   Lab Results  Component Value Date   TRIG 171.0 (H) 08/30/2017   Lab Results  Component Value Date   CHOLHDL 7 08/30/2017   No results found for: HGBA1C    Assessment & Plan:   Problem List Items Addressed This Visit      Cardiovascular and Mediastinum   Essential hypertension - Primary   Relevant Orders   CBC   Comprehensive metabolic panel   Urinalysis, Routine w reflex microscopic     Other   Elevated PSA, less than 10 ng/ml   Relevant Orders   PSA   Patient exposure to body fluids   Relevant Orders   Urine cytology ancillary only   HIV Antibody (routine testing w rflx)   RPR   Flat foot   Relevant Orders   DG Foot Complete Left   Ambulatory referral to Sports Medicine   Left foot pain   Relevant Orders   Uric acid      No orders of the defined types were placed in this encounter.   Follow-up: Return in about 1 month (around 08/07/2019), or follow up in one month with blood pressure cuff for a recheck..  Patient was given information on hypertension, flatfeet and safe sex.  We discussed a urology referral with further elevation of his PSA.  10/07/2019, MD

## 2019-07-10 NOTE — Patient Instructions (Addendum)
Flat Feet, Adult  Normally, a foot has a curve, called an arch, on its inner side. The arch creates a gap between the foot and the ground. Flat feet is a common condition in which one or both feet do not have an arch. What are the causes? This condition may be caused by:  Failure of a normal arch to develop during childhood.  An injury to tendons and ligaments in the foot, such as to the tendon that supports the arch (posterior tibial tendon).  Loose tendons or ligaments in the foot.  A wearing down of the arch over time.  Injury to bones in the foot.  An abnormality in the bones of the foot, called tarsal coalition. This happens when two or more bones in the foot are joined together (fused) before birth. What increases the risk? This condition is more likely to develop in:  Females.  Adults age 40 or older.  People who: ? Have a family history of flat feet. ? Have a history of childhood flexible flatfoot. ? Are obese. ? Have diabetes. ? Have high blood pressure. ? Participate in high-impact sports. ? Have inflammatory arthritis. ? Have a history of broken (fractured) or dislocated bones in the foot. What are the signs or symptoms? Symptoms of this condition include:  Pain or tightness along the bottom of the foot.  Foot pain that gets worse with activity.  Swelling of the inner side of the foot.  Swelling of the ankle.  Pain on the outer side of the ankle.  Changes in the way that you walk (gait).  Pronation. This is when the foot and ankle lean inward when you are standing.  Bony bumps on the top or inner side of the foot. How is this diagnosed? This condition is diagnosed with a physical exam of your foot and ankle. Your health care provider may also:  Look at your shoes for patterns of wear on the soles.  Order imaging tests, such as X-rays, a CT scan, or an MRI.  Refer you to a health care provider who specializes in feet (podiatrist) or a physical  therapist. How is this treated? This condition may be treated with:  Stretching exercises or physical therapy. This helps to increase range of motion and relieve pain.  A shoe insert (orthotic). This helps to support the arch of your foot. Orthotics can be purchased from a store or can be custom-made by your health care provider.  Wearing shoes with appropriate arch support. This is especially important for athletes.  Medicines. These may be prescribed to relieve pain.  An ankle brace, boot, or cast. These may be used to relieve pressure on your foot. You may be given crutches if walking is painful.  Surgery. This may be done to improve the alignment of your foot. This is only needed if your posterior tibial tendon is torn or if you have tarsal coalition. Follow these instructions at home: Activity  Do any exercises as told by your health care provider.  If an activity causes pain, avoid it or try to find another activity that does not cause pain. General instructions  Wear orthotics and appropriate shoes as told by your health care provider.  Take over-the-counter and prescription medicines only as told by your health care provider.  Wear an ankle brace, boot, or cast as told by your health care provider.  Use crutches as told by your health care provider.  Keep all follow-up visits as told by your health care   provider. This is important. How is this prevented? To prevent the condition from getting worse:  Wear comfortable, supportive shoes that are appropriate for your activities.  Maintain a healthy weight.  Stay active in a way that your health care provider recommends. This will help to keep your feet flexible and strong.  Manage long-term (chronic) health conditions, such as diabetes, high blood pressure, and inflammatory arthritis.  Work with a health care provider if you have concerns about your feet or shoes. Contact a health care provider if:  You have pain in  your foot or lower leg that gets worse or does not improve with medicine.  You have pain or difficulty when walking.  You have problems with your orthotics. Summary  Flat feet is a common condition in which one or both feet do not have a curve, called an arch, on the inner side.  Your health care provider may recommend a shoe insert (orthotic) or shoes with the appropriate arch support.  Other treatments may include stretching exercises or physical therapy, medicines to relieve pain, and wearing an ankle brace, boot, or cast.  Surgery may be done if you have a tear in the tendon that supports your arch (posterior tibial tendon) or if two or more of your foot bones were joined together (fused)  before birth (tarsal coalition). This information is not intended to replace advice given to you by your health care provider. Make sure you discuss any questions you have with your health care provider. Document Revised: 09/06/2018 Document Reviewed: 07/27/2016 Elsevier Patient Education  2020 [employer].  Hypertension, Adult Hypertension is another name for high blood pressure. High blood pressure forces your heart to work harder to pump blood. This can cause problems over time. There are two numbers in a blood pressure reading. There is a top number (systolic) over a bottom number (diastolic). It is best to have a blood pressure that is below 120/80. Healthy choices can help lower your blood pressure, or you may need medicine to help lower it. What are the causes? The cause of this condition is not known. Some conditions may be related to high blood pressure. What increases the risk?  Smoking.  Having type 2 diabetes mellitus, high cholesterol, or both.  Not getting enough exercise or physical activity.  Being overweight.  Having too much fat, sugar, calories, or salt (sodium) in your diet.  Drinking too much alcohol.  Having long-term (chronic) kidney disease.  Having a family  history of high blood pressure.  Age. Risk increases with age.  Race. You may be at higher risk if you are African American.  Gender. Men are at higher risk than women before age 54. After age 77, women are at higher risk than men.  Having obstructive sleep apnea.  Stress. What are the signs or symptoms?  High blood pressure may not cause symptoms. Very high blood pressure (hypertensive crisis) may cause: ? Headache. ? Feelings of worry or nervousness (anxiety). ? Shortness of breath. ? Nosebleed. ? A feeling of being sick to your stomach (nausea). ? Throwing up (vomiting). ? Changes in how you see. ? Very bad chest pain. ? Seizures. How is this treated?  This condition is treated by making healthy lifestyle changes, such as: ? Eating healthy foods. ? Exercising more. ? Drinking less alcohol.  Your health care provider may prescribe medicine if lifestyle changes are not enough to get your blood pressure under control, and if: ? Your top number is above 130. ?  Your bottom number is above 80.  Your personal target blood pressure may vary. Follow these instructions at home: Eating and drinking   If told, follow the DASH eating plan. To follow this plan: ? Fill one half of your plate at each meal with fruits and vegetables. ? Fill one fourth of your plate at each meal with whole grains. Whole grains include whole-wheat pasta, Morss rice, and whole-grain bread. ? Eat or drink low-fat dairy products, such as skim milk or low-fat yogurt. ? Fill one fourth of your plate at each meal with low-fat (lean) proteins. Low-fat proteins include fish, chicken without skin, eggs, beans, and tofu. ? Avoid fatty meat, cured and processed meat, or chicken with skin. ? Avoid pre-made or processed food.  Eat less than 1,500 mg of salt each day.  Do not drink alcohol if: ? Your doctor tells you not to drink. ? You are pregnant, may be pregnant, or are planning to become pregnant.  If you  drink alcohol: ? Limit how much you use to:  0-1 drink a day for women.  0-2 drinks a day for men. ? Be aware of how much alcohol is in your drink. In the [place] one drink equals one 12 oz bottle of beer (355 mL), one 5 oz glass of wine (148 mL), or one 1 oz glass of hard liquor (44 mL). Lifestyle   Work with your doctor to stay at a healthy weight or to lose weight. Ask your doctor what the best weight is for you.  Get at least 30 minutes of exercise most days of the week. This may include walking, swimming, or biking.  Get at least 30 minutes of exercise that strengthens your muscles (resistance exercise) at least 3 days a week. This may include lifting weights or doing Pilates.  Do not use any products that contain nicotine or tobacco, such as cigarettes, e-cigarettes, and chewing tobacco. If you need help quitting, ask your doctor.  Check your blood pressure at home as told by your doctor.  Keep all follow-up visits as told by your doctor. This is important. Medicines  Take over-the-counter and prescription medicines only as told by your doctor. Follow directions carefully.  Do not skip doses of blood pressure medicine. The medicine does not work as well if you skip doses. Skipping doses also puts you at risk for problems.  Ask your doctor about side effects or reactions to medicines that you should watch for. Contact a doctor if you:  Think you are having a reaction to the medicine you are taking.  Have headaches that keep coming back (recurring).  Feel dizzy.  Have swelling in your ankles.  Have trouble with your vision. Get help right away if you:  Get a very bad headache.  Start to feel mixed up (confused).  Feel weak or numb.  Feel faint.  Have very bad pain in your: ? Chest. ? Belly (abdomen).  Throw up more than once.  Have trouble breathing. Summary  Hypertension is another name for high blood pressure.  High blood pressure forces your heart to  work harder to pump blood.  For most people, a normal blood pressure is less than 120/80.  Making healthy choices can help lower blood pressure. If your blood pressure does not get lower with healthy choices, you may need to take medicine. This information is not intended to replace advice given to you by your health care provider. Make sure you discuss any questions you have with  your health care provider. Document Revised: 01/24/2018 Document Reviewed: 01/24/2018 Elsevier Patient Education  2020 [employer].  Safe Sex Practicing safe sex means taking steps before and during sex to reduce your risk of:  Getting an STI (sexually transmitted infection).  Giving your partner an STI.  Unwanted or unplanned pregnancy. How can I practice safe sex?     Ways you can practice safe sex  Limit your sexual partners to only one partner who is having sex with only you.  Avoid using alcohol and drugs before having sex. Alcohol and drugs can affect your judgment.  Before having sex with a new partner: ? Talk to your partner about past partners, past STIs, and drug use. ? Get screened for STIs and discuss the results with your partner. Ask your partner to get screened, too.  Check your body regularly for sores, blisters, rashes, or unusual discharge. If you notice any of these problems, visit your health care provider.  Avoid sexual contact if you have symptoms of an infection or you are being treated for an STI.  While having sex, use a condom. Make sure to: ? Use a condom every time you have vaginal, oral, or anal sex. Both females and males should wear condoms during oral sex. ? Keep condoms in place from the beginning to the end of sexual activity. ? Use a latex condom, if possible. Latex condoms offer the best protection. ? Use only water-based lubricants with a condom. Using petroleum-based lubricants or oils will weaken the condom and increase the chance that it will break. Ways your  health care provider can help you practice safe sex  See your health care provider for regular screenings, exams, and tests for STIs.  Talk with your health care provider about what kind of birth control (contraception) is best for you.  Get vaccinated against hepatitis B and human papillomavirus (HPV).  If you are at risk of being infected with HIV (human immunodeficiency virus), talk with your health care provider about taking a prescription medicine to prevent HIV infection. You are at risk for HIV if you: ? Are a man who has sex with other men. ? Are sexually active with more than one partner. ? Take drugs by injection. ? Have a sex partner who has HIV. ? Have unprotected sex. ? Have sex with someone who has sex with both men and women. ? Have had an STI. Follow these instructions at home:  Take over-the-counter and prescription medicines as told by your health care provider.  Keep all follow-up visits as told by your health care provider. This is important. Where to find more information  Centers for Disease Control and Prevention: LessFurniture.be  Planned Parenthood: https://www.plannedparenthood.org/  Office on Women's Health: EmploymentTracking.tn Summary  Practicing safe sex means taking steps before and during sex to reduce your risk of STIs, giving your partner STIs, and having an unwanted or unplanned pregnancy.  Before having sex with a new partner, talk to your partner about past partners, past STIs, and drug use.  Use a condom every time you have vaginal, oral, or anal sex. Both females and males should wear condoms during oral sex.  Check your body regularly for sores, blisters, rashes, or unusual discharge. If you notice any of these problems, visit your health care provider.  See your health care provider for regular screenings, exams, and tests for STIs. This information is not  intended to replace advice given to you by your health care provider. Make sure you  discuss any questions you have with your health care provider. Document Revised: 09/07/2018 Document Reviewed: 02/26/2018 Elsevier Patient Education  2020 [employer].

## 2019-07-11 ENCOUNTER — Telehealth: Payer: Self-pay | Admitting: Family Medicine

## 2019-07-11 LAB — URINE CYTOLOGY ANCILLARY ONLY
Chlamydia: NEGATIVE
Comment: NEGATIVE
Comment: NORMAL
Neisseria Gonorrhea: NEGATIVE

## 2019-07-11 NOTE — Telephone Encounter (Signed)
Patient is calling and requesting a call back regarding lab work. CB is [phone]

## 2019-07-11 NOTE — Addendum Note (Signed)
Addended by: Varney Biles on: 07/11/2019 10:24 AM   Modules accepted: Orders

## 2019-07-11 NOTE — Telephone Encounter (Signed)
Returned patients call no answer LMTCB 

## 2019-07-12 NOTE — Telephone Encounter (Signed)
Patient is returning the call. CB is [phone]

## 2019-07-15 NOTE — Telephone Encounter (Signed)
Patient is returning the call. CB is [phone] 

## 2019-07-16 NOTE — Telephone Encounter (Signed)
Called pt to go over labs no answer LMTCB. Duplicate message will file note

## 2020-01-26 ENCOUNTER — Ambulatory Visit
Admission: EM | Admit: 2020-01-26 | Discharge: 2020-01-26 | Disposition: A | Discharge status: Home / Self Care | Payer: BLUE CROSS/BLUE SHIELD | Attending: Emergency Medicine | Admitting: Emergency Medicine

## 2020-01-26 ENCOUNTER — Encounter: Payer: Self-pay | Admitting: Emergency Medicine

## 2020-01-26 ENCOUNTER — Other Ambulatory Visit: Payer: Self-pay

## 2020-01-26 DIAGNOSIS — F439 Reaction to severe stress, unspecified: Secondary | ICD-10-CM | POA: Diagnosis not present

## 2020-01-26 DIAGNOSIS — I160 Hypertensive urgency: Secondary | ICD-10-CM

## 2020-01-26 LAB — POCT URINALYSIS DIP (MANUAL ENTRY)
Bilirubin, UA: NEGATIVE
Glucose, UA: NEGATIVE mg/dL
Ketones, POC UA: NEGATIVE mg/dL
Leukocytes, UA: NEGATIVE
Nitrite, UA: NEGATIVE
Protein Ur, POC: NEGATIVE mg/dL
Spec Grav, UA: 1.015 (ref 1.010–1.025)
Urobilinogen, UA: 0.2 E.U./dL
pH, UA: 6.5 (ref 5.0–8.0)

## 2020-01-26 MED ORDER — AMLODIPINE BESYLATE 5 MG PO TABS: 5.0000 mg | ORAL_TABLET | Freq: Every day | ORAL | 0 refills | Status: AC

## 2020-01-26 NOTE — Discharge Instructions (Addendum)
Important to take BP medication daily. Follow up with PCP for further evaluation and management. Go to ER for Mcclary urine, fever, worst headache of life, chest pain, difficulty breathing.

## 2020-01-26 NOTE — ED Provider Notes (Signed)
[facility]    CSN: [id number] Arrival date & time: 01/26/20  1457      History   Chief Complaint Chief Complaint  Patient presents with  . Headache    HPI Kenneth Harris is a 54 y.o. male presenting for elevated blood pressure readings, dark in urine.  States this began a few days ago: Under a lot of stress at home due to custody issues.  Denies SI/HI.  Feels safe at home.  Denies chest pain, abdominal pain, leg swelling, difficulty breathing.  Does note intermittent headaches: Alleviated with Tylenol.   History reviewed. No pertinent past medical history.  Patient Active Problem List   Diagnosis Date Noted  . Essential hypertension 07/10/2019  . Patient exposure to body fluids 07/10/2019  . Flat foot 07/10/2019  . Left foot pain 07/10/2019  . Elevated PSA, less than 10 ng/ml 09/04/2017  . Acute prostatitis 09/04/2017  . Elevated LDL cholesterol level 09/04/2017  . Encounter for health maintenance examination with abnormal findings 08/30/2017  . Laceration of left lower extremity 08/30/2017  . Oral herpes 08/30/2017    History reviewed. No pertinent surgical history.     Home Medications    Prior to Admission medications   Medication Sig Start Date End Date Taking? Authorizing Provider  amLODipine (NORVASC) 5 MG tablet Take 1 tablet (5 mg total) by mouth daily. 01/26/20   Hall-Potvin, [place], PA-C    Family History History reviewed. No pertinent family history.  Social History Social History   Tobacco Use  . Smoking status: Never Smoker  . Smokeless tobacco: Never Used  Substance Use Topics  . Alcohol use: Yes  . Drug use: No     Allergies   Patient has no known allergies.   Review of Systems As per HPI   Physical Exam Triage Vital Signs ED Triage Vitals  Enc Vitals Group     BP      Pulse      Resp      Temp      Temp src      SpO2      Weight      Height      Head Circumference      Peak Flow      Pain Score      Pain  Loc      Pain Edu?      Excl. in GC?    No data found.  Updated Vital Signs BP (!) 192/112 (BP Location: Right Arm)   Pulse 85   Temp 98 F (36.7 C) (Oral)   Resp 18   SpO2 95%   Visual Acuity Right Eye Distance:   Left Eye Distance:   Bilateral Distance:    Right Eye Near:   Left Eye Near:    Bilateral Near:     Physical Exam Constitutional:      General: He is not in acute distress. HENT:     Head: Normocephalic and atraumatic.  Eyes:     General: No scleral icterus.    Pupils: Pupils are equal, round, and reactive to light.  Cardiovascular:     Rate and Rhythm: Normal rate.  Pulmonary:     Effort: Pulmonary effort is normal. No respiratory distress.     Breath sounds: No wheezing.  Skin:    Coloration: Skin is not jaundiced or pale.  Neurological:     Mental Status: He is alert and oriented to person, place, and time.  UC Treatments / Results  Labs (all labs ordered are listed, but only abnormal results are displayed) Labs Reviewed  POCT URINALYSIS DIP (MANUAL ENTRY) - Abnormal; Notable for the following components:      Result Value   Blood, UA large (*)    All other components within normal limits    EKG   Radiology No results found.  Procedures Procedures (including critical care time)  Medications Ordered in UC Medications - No data to display  Initial Impression / Assessment and Plan / UC Course  I have reviewed the triage vital signs and the nursing notes.  Pertinent labs & imaging results that were available during my care of the patient were reviewed by me and considered in my medical decision making (see chart for details).     Patient hypertensive, though without signs/symptoms of endorgan damage.  No active headache/relieved with Tylenol.  Urine with large blood.  No abdominal or back pain.  Will push fluids, start amlodipine, follow-up with PCP for further evaluation/management.  ER return precautions discussed, pt verbalized  understanding and is agreeable to plan. Final Clinical Impressions(s) / UC Diagnoses   Final diagnoses:  Hypertensive urgency  Stress at home     Discharge Instructions     Important to take BP medication daily. Follow up with PCP for further evaluation and management. Go to ER for Minter urine, fever, worst headache of life, chest pain, difficulty breathing.    ED Prescriptions    Medication Sig Dispense Auth. Provider   amLODipine (NORVASC) 5 MG tablet Take 1 tablet (5 mg total) by mouth daily. 30 tablet Hall-Potvin, [place], PA-C     PDMP not reviewed this encounter.   Hall-Potvin, [place], [place] 01/26/20 1625

## 2020-01-26 NOTE — ED Triage Notes (Signed)
Pt sts some HA today and feels his BP may be high with hx of similar during times of stress; pt sts increased stress lately; pt sts noted some color change to urine

## 2020-07-16 IMAGING — DX DG FOOT COMPLETE 3+V*L*
3 series · 3 of 3 positions shown · non-contrast
Comparison: None.

CLINICAL DATA: Left foot pain.

EXAM:
LEFT FOOT - COMPLETE 3+ VIEW

[foot ap]
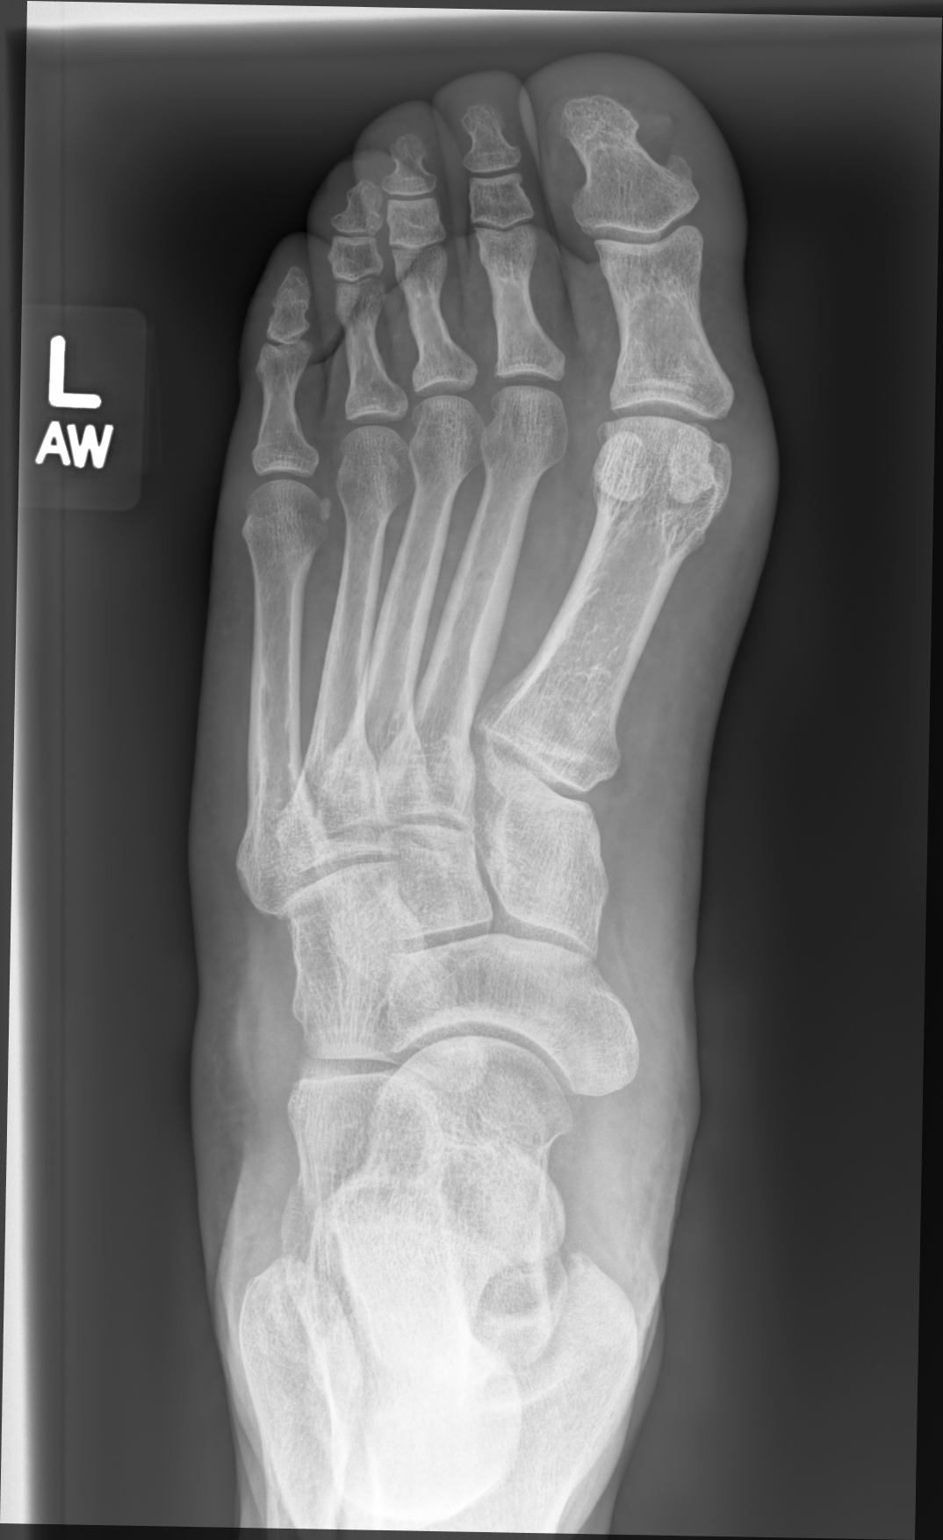

[foot mlo]
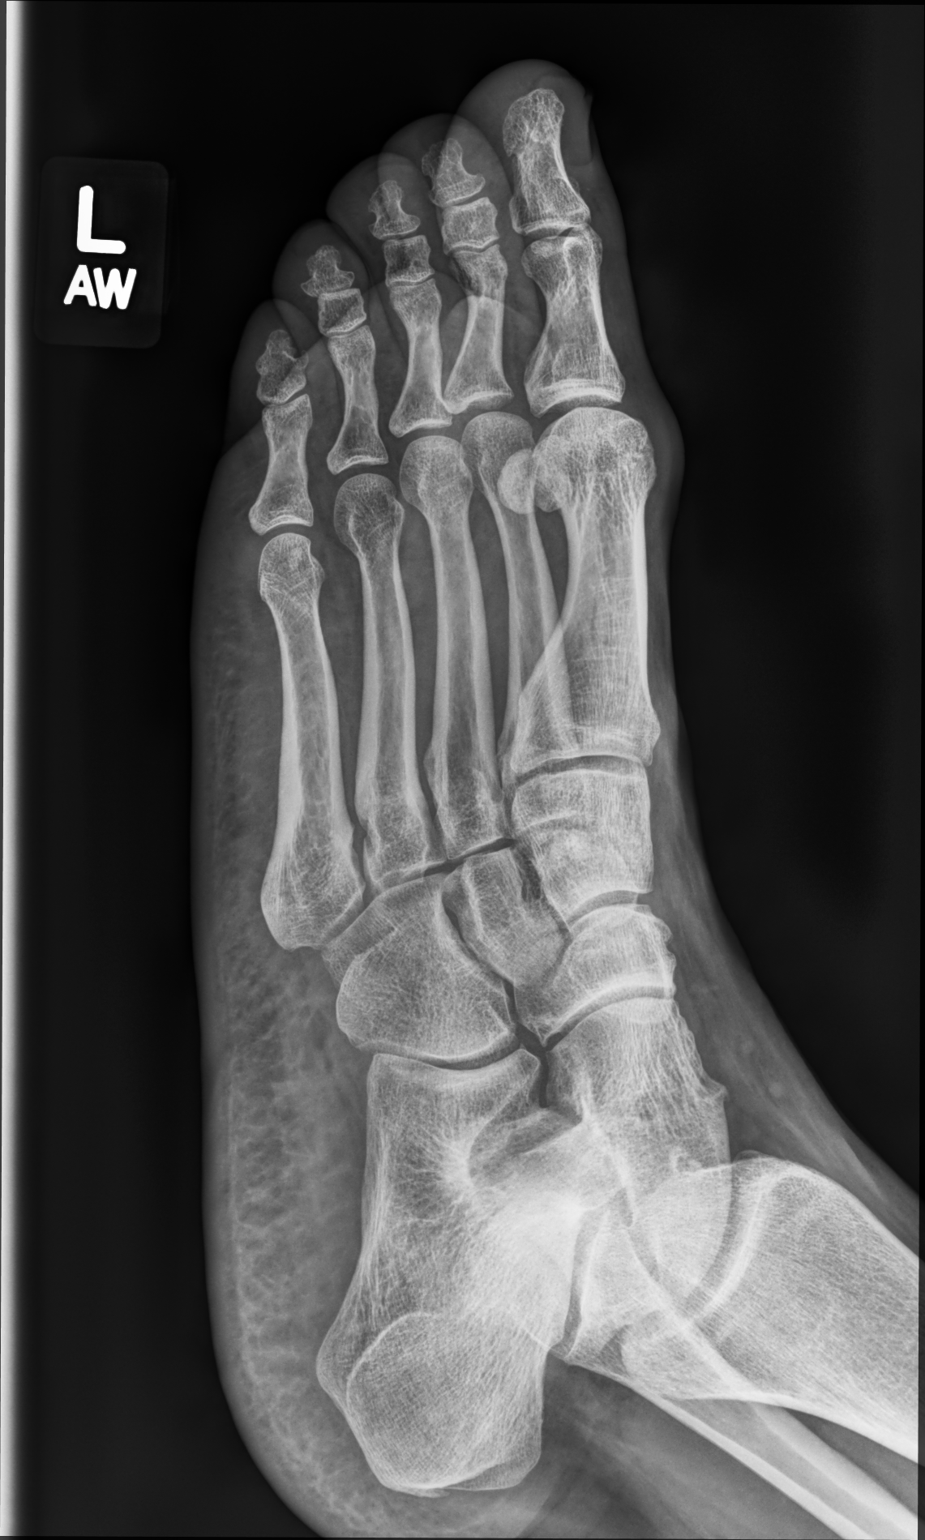

[foot lat]
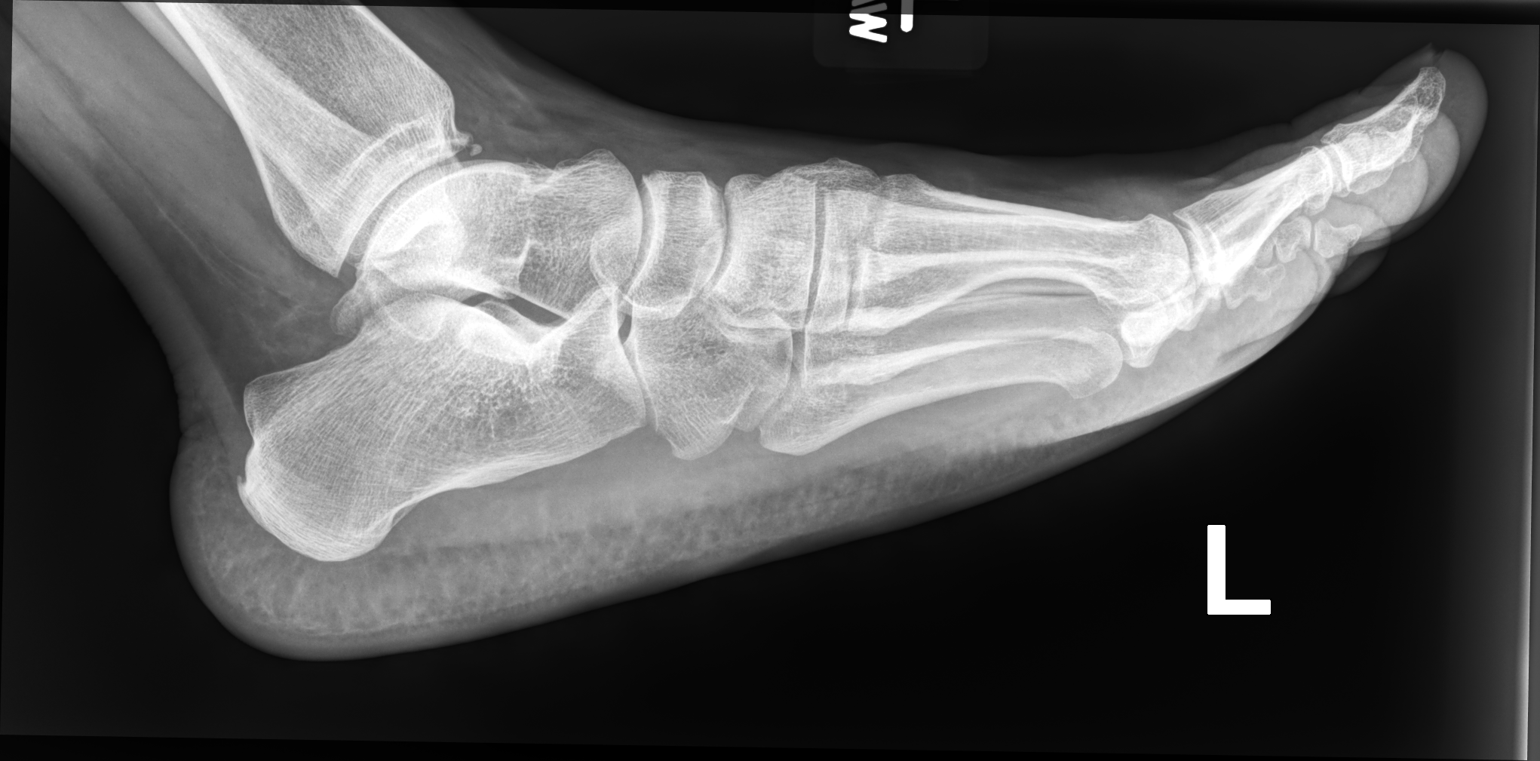

[3 of 3 positions shown; findings below may reference images not displayed]

FINDINGS: The joint spaces are maintained. No significant degenerative
changes. No acute bony findings. Mild pes planus.
IMPRESSION: No acute bony findings.

Mild pes planus.
